# Patient Record
Sex: Male | Born: 1970 | Race: White | Hispanic: No | Marital: Married | Smoking: Former smoker
Health system: Southern US, Community
[De-identification: ages and names within clinical notes are randomized; demographics above are authoritative.]

---

## 2008-11-28 ENCOUNTER — Ambulatory Visit: Payer: Self-pay | Admitting: Unknown Physician Specialty

## 2010-08-13 ENCOUNTER — Ambulatory Visit: Payer: Self-pay | Admitting: Unknown Physician Specialty

## 2010-08-15 ENCOUNTER — Ambulatory Visit: Payer: Self-pay | Admitting: Unknown Physician Specialty

## 2011-04-15 ENCOUNTER — Ambulatory Visit: Payer: Self-pay | Admitting: Unknown Physician Specialty

## 2011-04-17 ENCOUNTER — Ambulatory Visit: Payer: Self-pay | Admitting: Unknown Physician Specialty

## 2012-07-15 IMAGING — NM NM THYROID IMAGING W/ UPTAKE SINGLE (24 HR)
1 series · 3 of 3 positions shown · non-contrast
Comparison: none

REASON FOR EXAM: hyperthyroid
COMMENTS:

[Series 1000: (id) thyroid scan · 2.40mm/px · 3 of 3 slices shown]
[im 1/3]
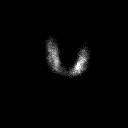
[im 2/3]
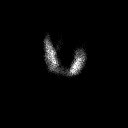
[im 3/3]
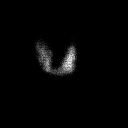

[3 of 3 positions shown; findings below may reference images not displayed]

PROCEDURE:     KNM - KNM THYROID N-E8C 24HR [DATE]  [DATE]

RESULT:     Following oral administration of 151.3 uCi radioactive iodine
N-E8C, the 7-hour thyroid uptake measures 39.9% and the 24-hour uptake
measures 50.5%. These values are in the hyperthyroid range.

Thyroid scan was performed in the anterior and both oblique views. The
thyroid lobes basically are symmetrical. No hot or cold nodules are
identified.
IMPRESSION: 1. Thyroid uptake values are in the hyperthyroid range compatible with
persistent or recurrent hyperthyroidism.
2. Normal thyroid scan.

## 2015-08-29 DIAGNOSIS — Z0000 Encounter for general adult medical examination without abnormal findings: Secondary | ICD-10-CM | POA: Diagnosis not present

## 2015-08-29 DIAGNOSIS — E039 Hypothyroidism, unspecified: Secondary | ICD-10-CM | POA: Diagnosis not present

## 2015-08-29 DIAGNOSIS — E0500 Thyrotoxicosis with diffuse goiter without thyrotoxic crisis or storm: Secondary | ICD-10-CM | POA: Diagnosis not present

## 2015-08-29 DIAGNOSIS — E890 Postprocedural hypothyroidism: Secondary | ICD-10-CM | POA: Diagnosis not present

## 2015-08-29 DIAGNOSIS — Z125 Encounter for screening for malignant neoplasm of prostate: Secondary | ICD-10-CM | POA: Diagnosis not present

## 2015-09-05 DIAGNOSIS — Z0000 Encounter for general adult medical examination without abnormal findings: Secondary | ICD-10-CM | POA: Diagnosis not present

## 2015-09-05 DIAGNOSIS — E0500 Thyrotoxicosis with diffuse goiter without thyrotoxic crisis or storm: Secondary | ICD-10-CM | POA: Diagnosis not present

## 2015-09-05 DIAGNOSIS — E890 Postprocedural hypothyroidism: Secondary | ICD-10-CM | POA: Diagnosis not present

## 2016-11-25 DIAGNOSIS — Z125 Encounter for screening for malignant neoplasm of prostate: Secondary | ICD-10-CM | POA: Diagnosis not present

## 2016-11-25 DIAGNOSIS — E890 Postprocedural hypothyroidism: Secondary | ICD-10-CM | POA: Diagnosis not present

## 2016-11-25 DIAGNOSIS — Z0000 Encounter for general adult medical examination without abnormal findings: Secondary | ICD-10-CM | POA: Diagnosis not present

## 2016-12-11 DIAGNOSIS — E0500 Thyrotoxicosis with diffuse goiter without thyrotoxic crisis or storm: Secondary | ICD-10-CM | POA: Diagnosis not present

## 2016-12-11 DIAGNOSIS — R002 Palpitations: Secondary | ICD-10-CM | POA: Diagnosis not present

## 2016-12-11 DIAGNOSIS — E890 Postprocedural hypothyroidism: Secondary | ICD-10-CM | POA: Diagnosis not present

## 2016-12-11 DIAGNOSIS — Z0000 Encounter for general adult medical examination without abnormal findings: Secondary | ICD-10-CM | POA: Diagnosis not present

## 2017-12-31 DIAGNOSIS — E0500 Thyrotoxicosis with diffuse goiter without thyrotoxic crisis or storm: Secondary | ICD-10-CM | POA: Diagnosis not present

## 2017-12-31 DIAGNOSIS — E890 Postprocedural hypothyroidism: Secondary | ICD-10-CM | POA: Diagnosis not present

## 2017-12-31 DIAGNOSIS — Z0000 Encounter for general adult medical examination without abnormal findings: Secondary | ICD-10-CM | POA: Diagnosis not present

## 2017-12-31 DIAGNOSIS — E782 Mixed hyperlipidemia: Secondary | ICD-10-CM | POA: Diagnosis not present

## 2018-01-07 DIAGNOSIS — Z0000 Encounter for general adult medical examination without abnormal findings: Secondary | ICD-10-CM | POA: Diagnosis not present

## 2018-01-07 DIAGNOSIS — Z1212 Encounter for screening for malignant neoplasm of rectum: Secondary | ICD-10-CM | POA: Diagnosis not present

## 2018-01-07 DIAGNOSIS — E890 Postprocedural hypothyroidism: Secondary | ICD-10-CM | POA: Diagnosis not present

## 2018-01-07 DIAGNOSIS — E0500 Thyrotoxicosis with diffuse goiter without thyrotoxic crisis or storm: Secondary | ICD-10-CM | POA: Diagnosis not present

## 2018-01-07 DIAGNOSIS — E782 Mixed hyperlipidemia: Secondary | ICD-10-CM | POA: Diagnosis not present

## 2018-03-31 DIAGNOSIS — E0590 Thyrotoxicosis, unspecified without thyrotoxic crisis or storm: Secondary | ICD-10-CM | POA: Diagnosis not present

## 2018-06-19 ENCOUNTER — Encounter: Payer: Self-pay | Admitting: Family Medicine

## 2018-06-19 ENCOUNTER — Ambulatory Visit: Payer: Self-pay | Admitting: Family Medicine

## 2018-06-19 ENCOUNTER — Other Ambulatory Visit: Payer: Self-pay

## 2018-06-19 VITALS — BP 100/72 | HR 73 | Temp 97.9°F | Resp 16 | Wt 186.6 lb

## 2018-06-19 DIAGNOSIS — H6983 Other specified disorders of Eustachian tube, bilateral: Secondary | ICD-10-CM

## 2018-06-19 DIAGNOSIS — H60501 Unspecified acute noninfective otitis externa, right ear: Secondary | ICD-10-CM

## 2018-06-19 MED ORDER — CIPROFLOXACIN-DEXAMETHASONE 0.3-0.1 % OT SUSP: 4.0000 [drp] | Freq: Two times a day (BID) | OTIC | 0 refills | Status: AC

## 2018-06-19 NOTE — Progress Notes (Signed)
George Long is a 48 y.o. male who presents today with 6 days of increasing ear pain and pressure of the right ear after recent vacation where the patient admits to snorkeling and swimming and airplane travel. He purchase some over the counter ear drops that have lidocaine in them and has been using them as prescribed without much symptom relief. He presents today for evaluation of ear fullness and pain. He denies any fever or systemic symptoms or any facial pain or recent illness. He does report seasonal allergies that he does not use any medication to treat.  Review of Systems  Constitutional: Negative for chills, fever and malaise/fatigue.  HENT: Positive for ear pain. Negative for congestion, ear discharge, hearing loss, sinus pain, sore throat and tinnitus.   Eyes: Negative.   Respiratory: Negative for cough, sputum production and shortness of breath.   Cardiovascular: Negative.  Negative for chest pain.  Gastrointestinal: Negative for abdominal pain, diarrhea, nausea and vomiting.  Genitourinary: Negative for dysuria, frequency, hematuria and urgency.  Musculoskeletal: Negative for myalgias.  Skin: Negative.   Neurological: Negative for headaches.  Endo/Heme/Allergies: Negative.   Psychiatric/Behavioral: Negative.     George Long has a current medication list which includes the following prescription(s): ibuprofen, levothyroxine, and ciprofloxacin-dexamethasone. Also is allergic to penicillins.  George Long  has no past medical history on file. Also  has no past surgical history on file.    O: Vitals:   06/19/18 0931  BP: 100/72  Pulse: 73  Resp: 16  Temp: 97.9 F (36.6 C)  SpO2: 98%     Physical Exam Vitals signs reviewed.  Constitutional:      General: He is not in acute distress.    Appearance: Normal appearance. He is well-developed and normal weight. He is not ill-appearing, toxic-appearing or diaphoretic.  HENT:     Head: Normocephalic.     Right Ear: Hearing normal.  Swelling and tenderness present. A middle ear effusion is present.     Left Ear: Hearing, ear canal and external ear normal. A middle ear effusion is present.     Ears:     Comments: Evidence of erythema and skin irritation to the inner canal    Nose: Septal deviation and rhinorrhea present. No congestion.     Right Sinus: No maxillary sinus tenderness or frontal sinus tenderness.     Left Sinus: No maxillary sinus tenderness or frontal sinus tenderness.     Mouth/Throat:     Mouth: Mucous membranes are moist.     Pharynx: Uvula midline. No oropharyngeal exudate or posterior oropharyngeal erythema.  Eyes:     General: Lids are normal.     Extraocular Movements: Extraocular movements intact.     Conjunctiva/sclera: Conjunctivae normal.  Neck:     Musculoskeletal: Full passive range of motion without pain, normal range of motion and neck supple.     Thyroid: Thyromegaly present.     Trachea: Trachea normal.  Cardiovascular:     Rate and Rhythm: Normal rate and regular rhythm.     Pulses: Normal pulses.     Heart sounds: Normal heart sounds.  Pulmonary:     Effort: Pulmonary effort is normal.     Breath sounds: Normal breath sounds.  Abdominal:     General: Bowel sounds are normal.     Palpations: Abdomen is soft.  Musculoskeletal: Normal range of motion.  Lymphadenopathy:     Head:     Right side of head: No submental, submandibular or tonsillar adenopathy.  Left side of head: No submental, submandibular or tonsillar adenopathy.     Cervical: No cervical adenopathy.     Right cervical: No superficial cervical adenopathy.    Left cervical: No superficial cervical adenopathy.  Skin:    General: Skin is warm.  Neurological:     Mental Status: He is alert and oriented to person, place, and time.  Psychiatric:        Mood and Affect: Mood normal.        Behavior: Behavior is cooperative.    A: 1. Acute otitis externa of right ear, unspecified type   2. Eustachian tube  dysfunction, bilateral    P: 1. Acute otitis externa of right ear, unspecified type Will treat for otitis externa - ciprofloxacin-dexamethasone (CIPRODEX) OTIC suspension; Place 4 drops into the right ear 2 (two) times daily for 7 days.  2. Eustachian tube dysfunction, bilateral Reviewed with patient instructions for supportive care yawning, chewing gum, forceful swallowing- follow up if condition persists beyond 7 days.  Other orders - levothyroxine (SYNTHROID, LEVOTHROID) 125 MCG tablet; TK 1 T PO QAM OES - ibuprofen (ADVIL,MOTRIN) 200 MG tablet; Take 200 mg by mouth every 6 (six) hours as needed.   Discussed with patient exam findings, suspected diagnosis etiology and  reviewed recommended treatment plan and follow up, including complications and indications for urgent medical follow up and evaluation. Medications including use and indications reviewed with patient. Patient provided relevant patient education on diagnosis and/or relevant related condition that were discussed and reviewed with patient at discharge. Patient verbalized understanding of information provided and agrees with plan of care (POC), all questions answered.

## 2018-06-19 NOTE — Patient Instructions (Signed)
Otitis Externa    Otitis externa is an infection of the outer ear canal. The outer ear canal is the area between the outside of the ear and the eardrum. Otitis externa is sometimes called swimmer's ear.  What are the causes?  Common causes of this condition include:   Swimming in dirty water.   Moisture in the ear.   An injury to the inside of the ear.   An object stuck in the ear.   A cut or scrape on the outside of the ear.  What increases the risk?  You are more likely to develop this condition if you go swimming often.  What are the signs or symptoms?  The first symptom of this condition is often itching in the ear. Later symptoms of the condition include:   Swelling of the ear.   Redness in the ear.   Ear pain. The pain may get worse when you pull on your ear.   Pus coming from the ear.  How is this diagnosed?  This condition may be diagnosed by examining the ear and testing fluid from the ear for bacteria and funguses.  How is this treated?  This condition may be treated with:   Antibiotic ear drops. These are often given for 10-14 days.   Medicines to reduce itching and swelling.  Follow these instructions at home:   If you were prescribed antibiotic ear drops, use them as told by your health care provider. Do not stop using the antibiotic even if your condition improves.   Take over-the-counter and prescription medicines only as told by your health care provider.   Avoid getting water in your ears as told by your health care provider. This may include avoiding swimming or water sports for a few days.   Keep all follow-up visits as told by your health care provider. This is important.  How is this prevented?   Keep your ears dry. Use the corner of a towel to dry your ears after you swim or bathe.   Avoid scratching or putting things in your ear. Doing these things can damage the ear canal or remove the protective wax that lines it, which makes it easier for bacteria and funguses to  grow.   Avoid swimming in lakes, polluted water, or pools that may not have enough chlorine.  Contact a health care provider if:   You have a fever.   Your ear is still red, swollen, painful, or draining pus after 3 days.   Your redness, swelling, or pain gets worse.   You have a severe headache.   You have redness, swelling, pain, or tenderness in the area behind your ear.  Summary   Otitis externa is an infection of the outer ear canal.   Common causes include swimming in dirty water, moisture in the ear, or a cut or scrape in the ear.   Symptoms include pain, redness, and swelling of the ear.   If you were prescribed antibiotic ear drops, use them as told by your health care provider. Do not stop using the antibiotic even if your condition improves.  This information is not intended to replace advice given to you by your health care provider. Make sure you discuss any questions you have with your health care provider.  Document Released: 04/29/2005 Document Revised: 10/03/2017 Document Reviewed: 10/03/2017  Elsevier Interactive Patient Education  2019 [employer]  Eustachian Tube Dysfunction    Eustachian tube dysfunction refers to a condition in which a   blockage develops in the narrow passage that connects the middle ear to the back of the nose (eustachian tube). The eustachian tube regulates air pressure in the middle ear by letting air move between the ear and nose. It also helps to drain fluid from the middle ear space.  Eustachian tube dysfunction can affect one or both ears. When the eustachian tube does not function properly, air pressure, fluid, or both can build up in the middle ear.  What are the causes?  This condition occurs when the eustachian tube becomes blocked or cannot open normally. Common causes of this condition include:   Ear infections.   Colds and other infections that affect the nose, mouth, and throat (upper respiratory tract).   Allergies.   Irritation from cigarette  smoke.   Irritation from stomach acid coming up into the esophagus (gastroesophageal reflux). The esophagus is the tube that carries food from the mouth to the stomach.   Sudden changes in air pressure, such as from descending in an airplane or scuba diving.   Abnormal growths in the nose or throat, such as:  ? Growths that line the nose (nasal polyps).  ? Abnormal growth of cells (tumors).  ? Enlarged tissue at the back of the throat (adenoids).  What increases the risk?  You are more likely to develop this condition if:   You smoke.   You are overweight.   You are a child who has:  ? Certain birth defects of the mouth, such as cleft palate.  ? Large tonsils or adenoids.  What are the signs or symptoms?  Common symptoms of this condition include:   A feeling of fullness in the ear.   Ear pain.   Clicking or popping noises in the ear.   Ringing in the ear.   Hearing loss.   Loss of balance.   Dizziness.  Symptoms may get worse when the air pressure around you changes, such as when you travel to an area of high elevation, fly on an airplane, or go scuba diving.  How is this diagnosed?  This condition may be diagnosed based on:   Your symptoms.   A physical exam of your ears, nose, and throat.   Tests, such as those that measure:  ? The movement of your eardrum (tympanogram).  ? Your hearing (audiometry).  How is this treated?  Treatment depends on the cause and severity of your condition.   In mild cases, you may relieve your symptoms by moving air into your ears. This is called "popping the ears."   In more severe cases, or if you have symptoms of fluid in your ears, treatment may include:  ? Medicines to relieve congestion (decongestants).  ? Medicines that treat allergies (antihistamines).  ? Nasal sprays or ear drops that contain medicines that reduce swelling (steroids).  ? A procedure to drain the fluid in your eardrum (myringotomy). In this procedure, a small tube is placed in the eardrum  to:   Drain the fluid.   Restore the air in the middle ear space.  ? A procedure to insert a balloon device through the nose to inflate the opening of the eustachian tube (balloon dilation).  Follow these instructions at home:  Lifestyle   Do not do any of the following until your health care provider approves:  ? Travel to high altitudes.  ? Fly in airplanes.  ? Work in a pressurized cabin or room.  ? Scuba dive.   Do not use any   products that contain nicotine or tobacco, such as cigarettes and e-cigarettes. If you need help quitting, ask your health care provider.   Keep your ears dry. Wear fitted earplugs during showering and bathing. Dry your ears completely after.  General instructions   Take over-the-counter and prescription medicines only as told by your health care provider.   Use techniques to help pop your ears as recommended by your health care provider. These may include:  ? Chewing gum.  ? Yawning.  ? Frequent, forceful swallowing.  ? Closing your mouth, holding your nose closed, and gently blowing as if you are trying to blow air out of your nose.   Keep all follow-up visits as told by your health care provider. This is important.  Contact a health care provider if:   Your symptoms do not go away after treatment.   Your symptoms come back after treatment.   You are unable to pop your ears.   You have:  ? A fever.  ? Pain in your ear.  ? Pain in your head or neck.  ? Fluid draining from your ear.   Your hearing suddenly changes.   You become very dizzy.   You lose your balance.  Summary   Eustachian tube dysfunction refers to a condition in which a blockage develops in the eustachian tube.   It can be caused by ear infections, allergies, inhaled irritants, or abnormal growths in the nose or throat.   Symptoms include ear pain, hearing loss, or ringing in the ears.   Mild cases are treated with maneuvers to unblock the ears, such as yawning or ear popping.   Severe cases are treated  with medicines. Surgery may also be done (rare).  This information is not intended to replace advice given to you by your health care provider. Make sure you discuss any questions you have with your health care provider.  Document Released: 05/26/2015 Document Revised: 08/19/2017 Document Reviewed: 08/19/2017  Elsevier Interactive Patient Education  2019 [employer]

## 2018-06-23 ENCOUNTER — Telehealth: Payer: Self-pay | Admitting: Emergency Medicine

## 2018-06-23 NOTE — Telephone Encounter (Signed)
Attempted to contact patient following up on visit with [facility]. No answer unable to leave message

## 2018-12-09 DIAGNOSIS — E0500 Thyrotoxicosis with diffuse goiter without thyrotoxic crisis or storm: Secondary | ICD-10-CM | POA: Diagnosis not present

## 2018-12-09 DIAGNOSIS — H532 Diplopia: Secondary | ICD-10-CM | POA: Diagnosis not present

## 2018-12-09 DIAGNOSIS — H05243 Constant exophthalmos, bilateral: Secondary | ICD-10-CM | POA: Diagnosis not present

## 2018-12-09 DIAGNOSIS — H47393 Other disorders of optic disc, bilateral: Secondary | ICD-10-CM | POA: Diagnosis not present

## 2018-12-15 DIAGNOSIS — H5008 Alternating esotropia with other noncomitancies: Secondary | ICD-10-CM | POA: Diagnosis not present

## 2018-12-15 DIAGNOSIS — H5022 Vertical strabismus, left eye: Secondary | ICD-10-CM | POA: Diagnosis not present

## 2018-12-15 DIAGNOSIS — H05243 Constant exophthalmos, bilateral: Secondary | ICD-10-CM | POA: Diagnosis not present

## 2018-12-15 DIAGNOSIS — E0590 Thyrotoxicosis, unspecified without thyrotoxic crisis or storm: Secondary | ICD-10-CM | POA: Diagnosis not present

## 2019-01-13 DIAGNOSIS — Z0000 Encounter for general adult medical examination without abnormal findings: Secondary | ICD-10-CM | POA: Diagnosis not present

## 2019-01-13 DIAGNOSIS — E890 Postprocedural hypothyroidism: Secondary | ICD-10-CM | POA: Diagnosis not present

## 2019-01-13 DIAGNOSIS — Z125 Encounter for screening for malignant neoplasm of prostate: Secondary | ICD-10-CM | POA: Diagnosis not present

## 2019-01-20 DIAGNOSIS — Z23 Encounter for immunization: Secondary | ICD-10-CM | POA: Diagnosis not present

## 2019-01-20 DIAGNOSIS — Z7189 Other specified counseling: Secondary | ICD-10-CM | POA: Diagnosis not present

## 2019-01-20 DIAGNOSIS — E890 Postprocedural hypothyroidism: Secondary | ICD-10-CM | POA: Diagnosis not present

## 2019-01-20 DIAGNOSIS — E782 Mixed hyperlipidemia: Secondary | ICD-10-CM | POA: Diagnosis not present

## 2019-01-20 DIAGNOSIS — Z0000 Encounter for general adult medical examination without abnormal findings: Secondary | ICD-10-CM | POA: Diagnosis not present

## 2019-01-21 DIAGNOSIS — J029 Acute pharyngitis, unspecified: Secondary | ICD-10-CM | POA: Diagnosis not present

## 2019-01-25 DIAGNOSIS — H05213 Displacement (lateral) of globe, bilateral: Secondary | ICD-10-CM | POA: Diagnosis not present

## 2019-01-25 DIAGNOSIS — H532 Diplopia: Secondary | ICD-10-CM | POA: Diagnosis not present

## 2019-01-25 DIAGNOSIS — E063 Autoimmune thyroiditis: Secondary | ICD-10-CM | POA: Diagnosis not present

## 2019-02-09 DIAGNOSIS — E063 Autoimmune thyroiditis: Secondary | ICD-10-CM | POA: Diagnosis not present

## 2019-02-22 DIAGNOSIS — E063 Autoimmune thyroiditis: Secondary | ICD-10-CM | POA: Diagnosis not present

## 2019-02-22 DIAGNOSIS — H05213 Displacement (lateral) of globe, bilateral: Secondary | ICD-10-CM | POA: Diagnosis not present

## 2019-02-22 DIAGNOSIS — H532 Diplopia: Secondary | ICD-10-CM | POA: Diagnosis not present

## 2019-03-01 DIAGNOSIS — E063 Autoimmune thyroiditis: Secondary | ICD-10-CM | POA: Diagnosis not present

## 2019-03-08 DIAGNOSIS — E0590 Thyrotoxicosis, unspecified without thyrotoxic crisis or storm: Secondary | ICD-10-CM | POA: Diagnosis not present

## 2019-03-08 DIAGNOSIS — H5022 Vertical strabismus, left eye: Secondary | ICD-10-CM | POA: Diagnosis not present

## 2019-03-08 DIAGNOSIS — H5008 Alternating esotropia with other noncomitancies: Secondary | ICD-10-CM | POA: Diagnosis not present

## 2019-03-08 DIAGNOSIS — H05243 Constant exophthalmos, bilateral: Secondary | ICD-10-CM | POA: Diagnosis not present

## 2019-03-15 ENCOUNTER — Encounter (HOSPITAL_COMMUNITY): Payer: Self-pay

## 2019-03-15 ENCOUNTER — Ambulatory Visit (HOSPITAL_COMMUNITY)
Admission: EM | Admit: 2019-03-15 | Discharge: 2019-03-15 | Disposition: A | Discharge status: Home / Self Care | Payer: BC Managed Care – PPO | Attending: Family Medicine | Admitting: Family Medicine

## 2019-03-15 ENCOUNTER — Ambulatory Visit (INDEPENDENT_AMBULATORY_CARE_PROVIDER_SITE_OTHER): Payer: BC Managed Care – PPO

## 2019-03-15 ENCOUNTER — Other Ambulatory Visit: Payer: Self-pay

## 2019-03-15 DIAGNOSIS — S92354A Nondisplaced fracture of fifth metatarsal bone, right foot, initial encounter for closed fracture: Secondary | ICD-10-CM

## 2019-03-15 NOTE — ED Triage Notes (Signed)
Pt states he was camping 2 days ago and he kicked a piece of wood with his right foot, pt feel to the ground on his right side. Pt states he is being limping x 2 days. Pt states he right foot is swelling and painful.

## 2019-03-15 NOTE — Discharge Instructions (Addendum)
Use ice and elevation for pain and swelling Take ibuprofen or acetaminophen for pain Wear stiff shoe at all times that you are up on the foot  Follow up with your PCP or an orthopedic in a week or two

## 2019-03-15 NOTE — ED Provider Notes (Signed)
[facility]    CSN: [id number] Arrival date & time: 03/15/19  1204      History   Chief Complaint Chief Complaint  Patient presents with  . Foot Injury    HPI George Long is a 48 y.o. male.   HPI  Patient kicked a piece of wood with his right foot.  It was immediately painful.  He fell to the ground.  He has been having pain ever since.  He is limping.  The side of his foot is black and blue.  No prior problems with his feet. Hypothyroid.  Well-controlled on medication.  No other major medical problems.  History reviewed. No pertinent past medical history.  There are no active problems to display for this patient.   History reviewed. No pertinent surgical history.     Home Medications    Prior to Admission medications   Medication Sig Start Date End Date Taking? Authorizing Provider  ibuprofen (ADVIL,MOTRIN) 200 MG tablet Take 200 mg by mouth every 6 (six) hours as needed.    [provider]  levothyroxine (SYNTHROID, LEVOTHROID) 125 MCG tablet TK 1 T PO QAM OES 06/06/18   [provider]    Family History History reviewed. No pertinent family history.  Social History Social History   Tobacco Use  . Smoking status: Former Games developer  . Smokeless tobacco: Never Used  Substance Use Topics  . Alcohol use: Not on file  . Drug use: Not on file     Allergies   Penicillins   Review of Systems Review of Systems  Constitutional: Negative for chills and fever.  HENT: Negative for ear pain and sore throat.   Eyes: Negative for pain and visual disturbance.  Respiratory: Negative for cough and shortness of breath.   Cardiovascular: Negative for chest pain and palpitations.  Gastrointestinal: Negative for abdominal pain and vomiting.  Genitourinary: Negative for dysuria and hematuria.  Musculoskeletal: Positive for gait problem. Negative for arthralgias and back pain.  Skin: Negative for color change and rash.  Neurological: Negative  for seizures and syncope.  All other systems reviewed and are negative.    Physical Exam Triage Vital Signs ED Triage Vitals  Enc Vitals Group     BP 03/15/19 1224 128/87     Pulse Rate 03/15/19 1224 68     Resp 03/15/19 1224 16     Temp 03/15/19 1224 98.8 F (37.1 C)     Temp Source 03/15/19 1224 Oral     SpO2 03/15/19 1224 99 %     Weight --      Height --      Head Circumference --      Peak Flow --      Pain Score 03/15/19 1222 4     Pain Loc --      Pain Edu? --      Excl. in GC? --    No data found.  Updated Vital Signs BP 128/87 (BP Location: Right Arm)   Pulse 68   Temp 98.8 F (37.1 C) (Oral)   Resp 16   SpO2 99%       Physical Exam Constitutional:      General: He is not in acute distress.    Appearance: He is well-developed.  HENT:     Head: Normocephalic and atraumatic.  Eyes:     Conjunctiva/sclera: Conjunctivae normal.     Pupils: Pupils are equal, round, and reactive to light.  Neck:     Musculoskeletal: Normal  range of motion.  Cardiovascular:     Rate and Rhythm: Normal rate.  Pulmonary:     Effort: Pulmonary effort is normal. No respiratory distress.  Abdominal:     General: There is no distension.     Palpations: Abdomen is soft.  Musculoskeletal: Normal range of motion.     Comments: Swelling and discoloration of right foot.  Purple discoloration around across the lateral foot and toes.  Tenderness is localized to the tip of the proximal fifth metatarsal  Skin:    General: Skin is warm and dry.  Neurological:     Mental Status: He is alert.      UC Treatments / Results  Labs (all labs ordered are listed, but only abnormal results are displayed) Labs Reviewed - No data to display  EKG   Radiology Dg Foot Complete Right  Result Date: 03/15/2019 CLINICAL DATA:  Lateral foot pain and swelling after tripping over a piece of wood while camping on Friday. EXAM: RIGHT FOOT COMPLETE - 3+ VIEW COMPARISON:  None. FINDINGS: Acute  avulsion fracture of the base of the fifth metatarsal with 3 mm medial displacement. No additional fracture. No dislocation. Joint spaces are preserved. Bone mineralization is normal. Soft tissues are unremarkable. IMPRESSION: 1. Acute mildly displaced avulsion fracture at the base of the fifth metatarsal. Electronically Signed   By: Titus Dubin M.D.   On: 03/15/2019 12:45    Procedures Procedures (including critical care time)  Medications Ordered in UC Medications - No data to display  Initial Impression / Assessment and Plan / UC Course  I have reviewed the triage vital signs and the nursing notes.  Pertinent labs & imaging results that were available during my care of the patient were reviewed by me and considered in my medical decision making (see chart for details).     A proximal fifth metatarsal fracture that looks like it will heal well with conservative care.  Not in the Jones fracture area that will need surgery.  Recommend follow-up with PCP or Ortho Final Clinical Impressions(s) / UC Diagnoses   Final diagnoses:  Nondisplaced fracture of fifth metatarsal bone, right foot, initial encounter for closed fracture     Discharge Instructions     Use ice and elevation for pain and swelling Take ibuprofen or acetaminophen for pain Wear stiff shoe at all times that you are up on the foot  Follow up with your PCP or an orthopedic in a week or two    ED Prescriptions    None     PDMP not reviewed this encounter.   Raylene Everts, MD 03/15/19 2016

## 2019-03-30 DIAGNOSIS — E063 Autoimmune thyroiditis: Secondary | ICD-10-CM | POA: Diagnosis not present

## 2019-03-30 DIAGNOSIS — E0500 Thyrotoxicosis with diffuse goiter without thyrotoxic crisis or storm: Secondary | ICD-10-CM | POA: Diagnosis not present

## 2019-04-20 DIAGNOSIS — E063 Autoimmune thyroiditis: Secondary | ICD-10-CM | POA: Diagnosis not present

## 2019-04-20 DIAGNOSIS — E0500 Thyrotoxicosis with diffuse goiter without thyrotoxic crisis or storm: Secondary | ICD-10-CM | POA: Diagnosis not present

## 2019-04-26 DIAGNOSIS — H05243 Constant exophthalmos, bilateral: Secondary | ICD-10-CM | POA: Diagnosis not present

## 2019-04-26 DIAGNOSIS — H5008 Alternating esotropia with other noncomitancies: Secondary | ICD-10-CM | POA: Diagnosis not present

## 2019-04-26 DIAGNOSIS — E0590 Thyrotoxicosis, unspecified without thyrotoxic crisis or storm: Secondary | ICD-10-CM | POA: Diagnosis not present

## 2019-04-26 DIAGNOSIS — H5022 Vertical strabismus, left eye: Secondary | ICD-10-CM | POA: Diagnosis not present

## 2019-05-03 DIAGNOSIS — E063 Autoimmune thyroiditis: Secondary | ICD-10-CM | POA: Diagnosis not present

## 2019-05-03 DIAGNOSIS — H532 Diplopia: Secondary | ICD-10-CM | POA: Diagnosis not present

## 2019-05-03 DIAGNOSIS — H05213 Displacement (lateral) of globe, bilateral: Secondary | ICD-10-CM | POA: Diagnosis not present

## 2019-05-26 DIAGNOSIS — E0590 Thyrotoxicosis, unspecified without thyrotoxic crisis or storm: Secondary | ICD-10-CM | POA: Diagnosis not present

## 2019-05-26 DIAGNOSIS — H5008 Alternating esotropia with other noncomitancies: Secondary | ICD-10-CM | POA: Diagnosis not present

## 2019-05-26 DIAGNOSIS — H05243 Constant exophthalmos, bilateral: Secondary | ICD-10-CM | POA: Diagnosis not present

## 2019-05-26 DIAGNOSIS — H5022 Vertical strabismus, left eye: Secondary | ICD-10-CM | POA: Diagnosis not present

## 2019-06-09 DIAGNOSIS — H532 Diplopia: Secondary | ICD-10-CM | POA: Diagnosis not present

## 2019-06-09 DIAGNOSIS — H05213 Displacement (lateral) of globe, bilateral: Secondary | ICD-10-CM | POA: Diagnosis not present

## 2019-06-09 DIAGNOSIS — E063 Autoimmune thyroiditis: Secondary | ICD-10-CM | POA: Diagnosis not present

## 2019-08-02 DIAGNOSIS — H532 Diplopia: Secondary | ICD-10-CM | POA: Diagnosis not present

## 2019-08-02 DIAGNOSIS — H05213 Displacement (lateral) of globe, bilateral: Secondary | ICD-10-CM | POA: Diagnosis not present

## 2019-08-02 DIAGNOSIS — E063 Autoimmune thyroiditis: Secondary | ICD-10-CM | POA: Diagnosis not present

## 2019-08-30 DIAGNOSIS — H05243 Constant exophthalmos, bilateral: Secondary | ICD-10-CM | POA: Diagnosis not present

## 2019-08-30 DIAGNOSIS — H532 Diplopia: Secondary | ICD-10-CM | POA: Diagnosis not present

## 2019-08-30 DIAGNOSIS — H5022 Vertical strabismus, left eye: Secondary | ICD-10-CM | POA: Diagnosis not present

## 2019-08-30 DIAGNOSIS — H5008 Alternating esotropia with other noncomitancies: Secondary | ICD-10-CM | POA: Diagnosis not present

## 2019-09-01 DIAGNOSIS — E063 Autoimmune thyroiditis: Secondary | ICD-10-CM | POA: Diagnosis not present

## 2019-09-01 DIAGNOSIS — E0500 Thyrotoxicosis with diffuse goiter without thyrotoxic crisis or storm: Secondary | ICD-10-CM | POA: Diagnosis not present

## 2019-09-22 DIAGNOSIS — E063 Autoimmune thyroiditis: Secondary | ICD-10-CM | POA: Diagnosis not present

## 2019-09-22 DIAGNOSIS — E0500 Thyrotoxicosis with diffuse goiter without thyrotoxic crisis or storm: Secondary | ICD-10-CM | POA: Diagnosis not present

## 2019-10-13 DIAGNOSIS — E0500 Thyrotoxicosis with diffuse goiter without thyrotoxic crisis or storm: Secondary | ICD-10-CM | POA: Diagnosis not present

## 2019-10-13 DIAGNOSIS — E063 Autoimmune thyroiditis: Secondary | ICD-10-CM | POA: Diagnosis not present

## 2019-10-20 DIAGNOSIS — H5022 Vertical strabismus, left eye: Secondary | ICD-10-CM | POA: Diagnosis not present

## 2019-10-20 DIAGNOSIS — H5008 Alternating esotropia with other noncomitancies: Secondary | ICD-10-CM | POA: Diagnosis not present

## 2019-10-20 DIAGNOSIS — H05243 Constant exophthalmos, bilateral: Secondary | ICD-10-CM | POA: Diagnosis not present

## 2019-10-20 DIAGNOSIS — E0590 Thyrotoxicosis, unspecified without thyrotoxic crisis or storm: Secondary | ICD-10-CM | POA: Diagnosis not present

## 2019-11-02 DIAGNOSIS — E063 Autoimmune thyroiditis: Secondary | ICD-10-CM | POA: Diagnosis not present

## 2019-11-02 DIAGNOSIS — H532 Diplopia: Secondary | ICD-10-CM | POA: Diagnosis not present

## 2019-11-02 DIAGNOSIS — H05213 Displacement (lateral) of globe, bilateral: Secondary | ICD-10-CM | POA: Diagnosis not present

## 2019-11-03 DIAGNOSIS — E063 Autoimmune thyroiditis: Secondary | ICD-10-CM | POA: Diagnosis not present

## 2019-11-03 DIAGNOSIS — E0500 Thyrotoxicosis with diffuse goiter without thyrotoxic crisis or storm: Secondary | ICD-10-CM | POA: Diagnosis not present

## 2019-11-24 DIAGNOSIS — E063 Autoimmune thyroiditis: Secondary | ICD-10-CM | POA: Diagnosis not present

## 2019-11-24 DIAGNOSIS — E0500 Thyrotoxicosis with diffuse goiter without thyrotoxic crisis or storm: Secondary | ICD-10-CM | POA: Diagnosis not present

## 2019-12-01 ENCOUNTER — Other Ambulatory Visit: Payer: BC Managed Care – PPO

## 2019-12-15 DIAGNOSIS — E0500 Thyrotoxicosis with diffuse goiter without thyrotoxic crisis or storm: Secondary | ICD-10-CM | POA: Diagnosis not present

## 2019-12-15 DIAGNOSIS — E063 Autoimmune thyroiditis: Secondary | ICD-10-CM | POA: Diagnosis not present

## 2020-01-06 DIAGNOSIS — H05243 Constant exophthalmos, bilateral: Secondary | ICD-10-CM | POA: Diagnosis not present

## 2020-01-06 DIAGNOSIS — H524 Presbyopia: Secondary | ICD-10-CM | POA: Diagnosis not present

## 2020-01-25 DIAGNOSIS — E890 Postprocedural hypothyroidism: Secondary | ICD-10-CM | POA: Diagnosis not present

## 2020-01-25 DIAGNOSIS — Z0000 Encounter for general adult medical examination without abnormal findings: Secondary | ICD-10-CM | POA: Diagnosis not present

## 2020-01-25 DIAGNOSIS — E782 Mixed hyperlipidemia: Secondary | ICD-10-CM | POA: Diagnosis not present

## 2020-01-25 DIAGNOSIS — Z125 Encounter for screening for malignant neoplasm of prostate: Secondary | ICD-10-CM | POA: Diagnosis not present

## 2020-01-26 DIAGNOSIS — E063 Autoimmune thyroiditis: Secondary | ICD-10-CM | POA: Diagnosis not present

## 2020-01-26 DIAGNOSIS — H532 Diplopia: Secondary | ICD-10-CM | POA: Diagnosis not present

## 2020-01-26 DIAGNOSIS — H05213 Displacement (lateral) of globe, bilateral: Secondary | ICD-10-CM | POA: Diagnosis not present

## 2020-02-02 DIAGNOSIS — E890 Postprocedural hypothyroidism: Secondary | ICD-10-CM | POA: Diagnosis not present

## 2020-02-02 DIAGNOSIS — E782 Mixed hyperlipidemia: Secondary | ICD-10-CM | POA: Diagnosis not present

## 2020-02-02 DIAGNOSIS — E0500 Thyrotoxicosis with diffuse goiter without thyrotoxic crisis or storm: Secondary | ICD-10-CM | POA: Diagnosis not present

## 2020-02-02 DIAGNOSIS — Z23 Encounter for immunization: Secondary | ICD-10-CM | POA: Diagnosis not present

## 2020-02-02 DIAGNOSIS — Z0000 Encounter for general adult medical examination without abnormal findings: Secondary | ICD-10-CM | POA: Diagnosis not present

## 2020-04-20 DIAGNOSIS — E782 Mixed hyperlipidemia: Secondary | ICD-10-CM | POA: Diagnosis not present

## 2020-04-20 DIAGNOSIS — E890 Postprocedural hypothyroidism: Secondary | ICD-10-CM | POA: Diagnosis not present

## 2020-04-20 DIAGNOSIS — E0500 Thyrotoxicosis with diffuse goiter without thyrotoxic crisis or storm: Secondary | ICD-10-CM | POA: Diagnosis not present

## 2020-04-26 DIAGNOSIS — E782 Mixed hyperlipidemia: Secondary | ICD-10-CM | POA: Diagnosis not present

## 2020-04-26 DIAGNOSIS — E890 Postprocedural hypothyroidism: Secondary | ICD-10-CM | POA: Diagnosis not present

## 2020-04-26 DIAGNOSIS — E0500 Thyrotoxicosis with diffuse goiter without thyrotoxic crisis or storm: Secondary | ICD-10-CM | POA: Diagnosis not present

## 2020-06-05 DIAGNOSIS — H532 Diplopia: Secondary | ICD-10-CM | POA: Diagnosis not present

## 2020-06-05 DIAGNOSIS — H05213 Displacement (lateral) of globe, bilateral: Secondary | ICD-10-CM | POA: Diagnosis not present

## 2020-06-05 DIAGNOSIS — E063 Autoimmune thyroiditis: Secondary | ICD-10-CM | POA: Diagnosis not present

## 2020-06-08 DIAGNOSIS — E063 Autoimmune thyroiditis: Secondary | ICD-10-CM | POA: Diagnosis not present

## 2020-06-13 DIAGNOSIS — H05213 Displacement (lateral) of globe, bilateral: Secondary | ICD-10-CM | POA: Diagnosis not present

## 2020-06-14 IMAGING — DX DG FOOT COMPLETE 3+V*R*
3 series · 3 of 3 positions shown · non-contrast
Comparison: None.

CLINICAL DATA: Lateral foot pain and swelling after tripping over a
piece of Jumper while camping on [REDACTED].

EXAM:
RIGHT FOOT COMPLETE - 3+ VIEW

[foot ap]
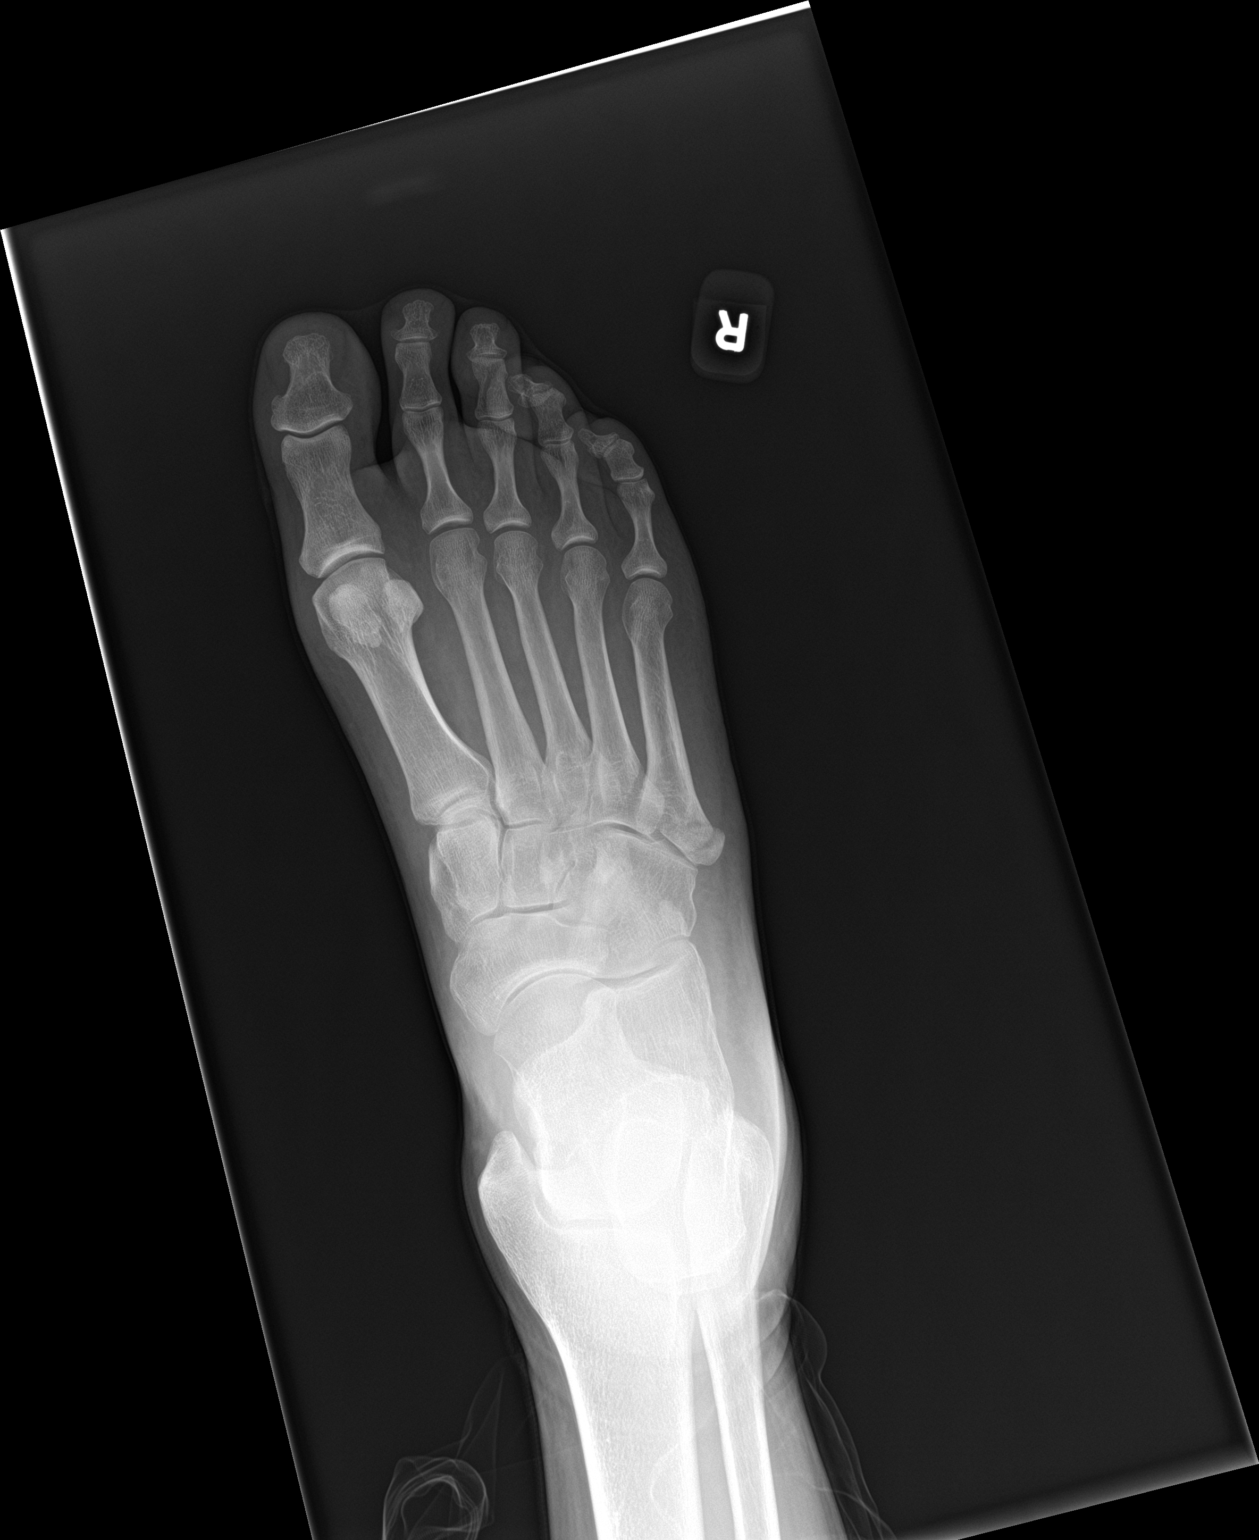

[foot obl]
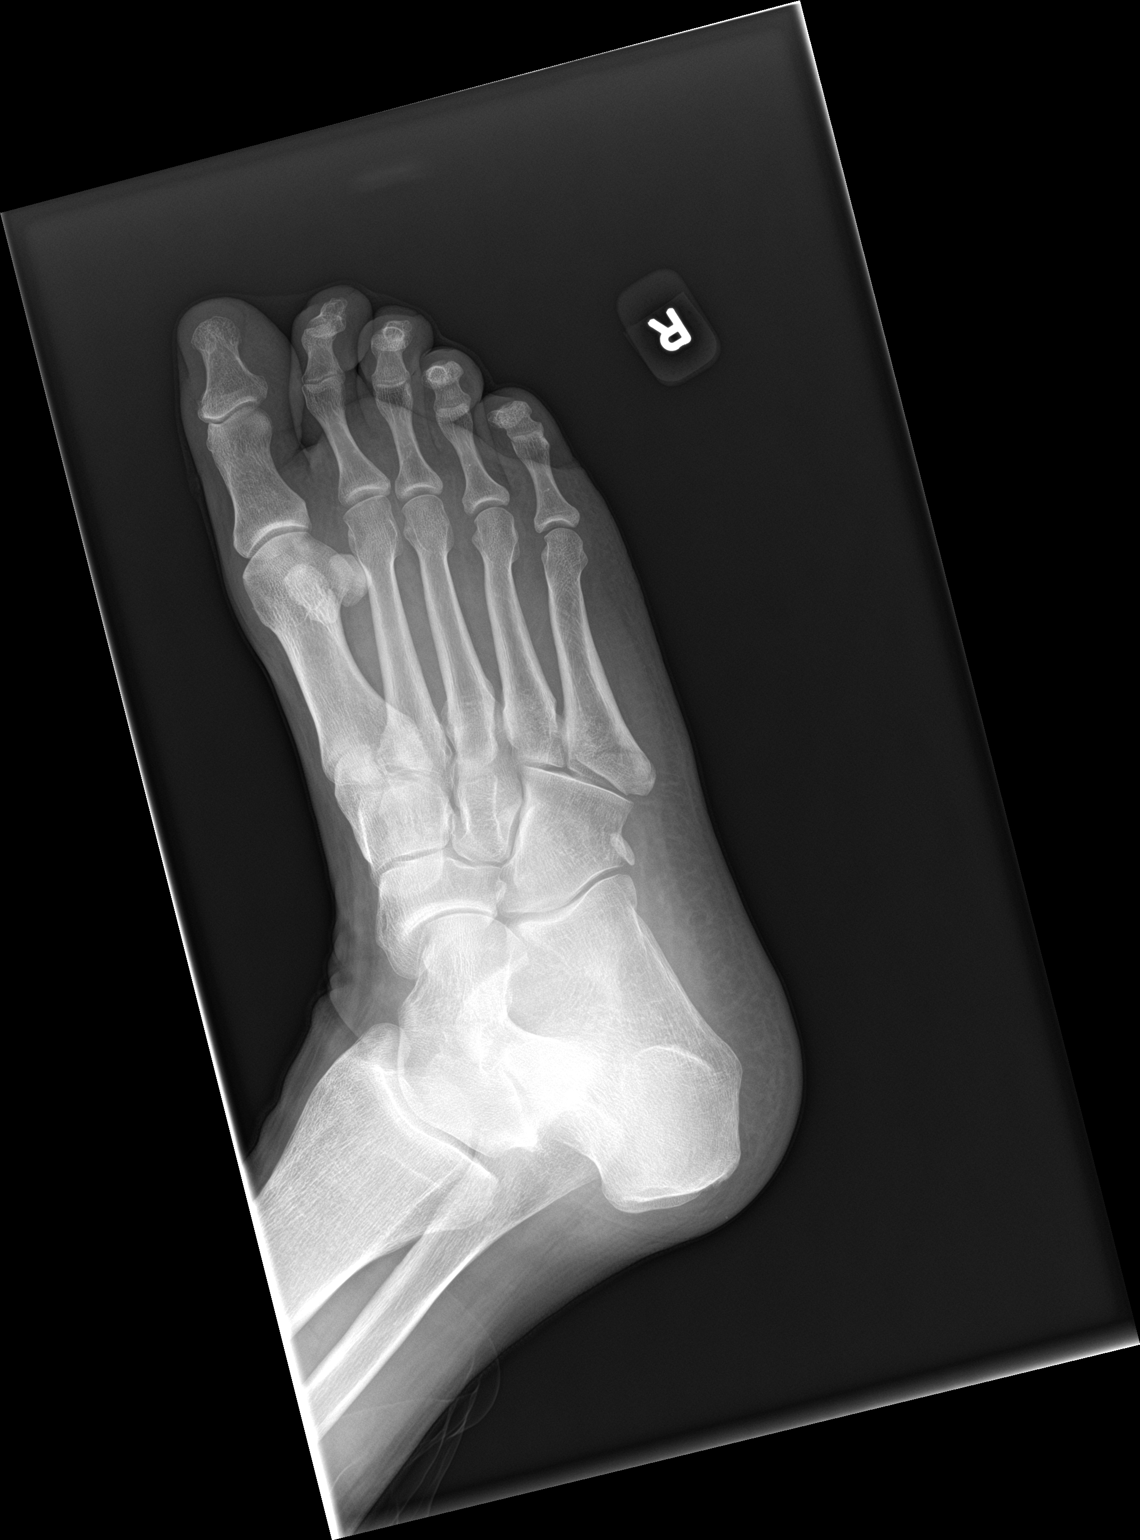

[foot lat]
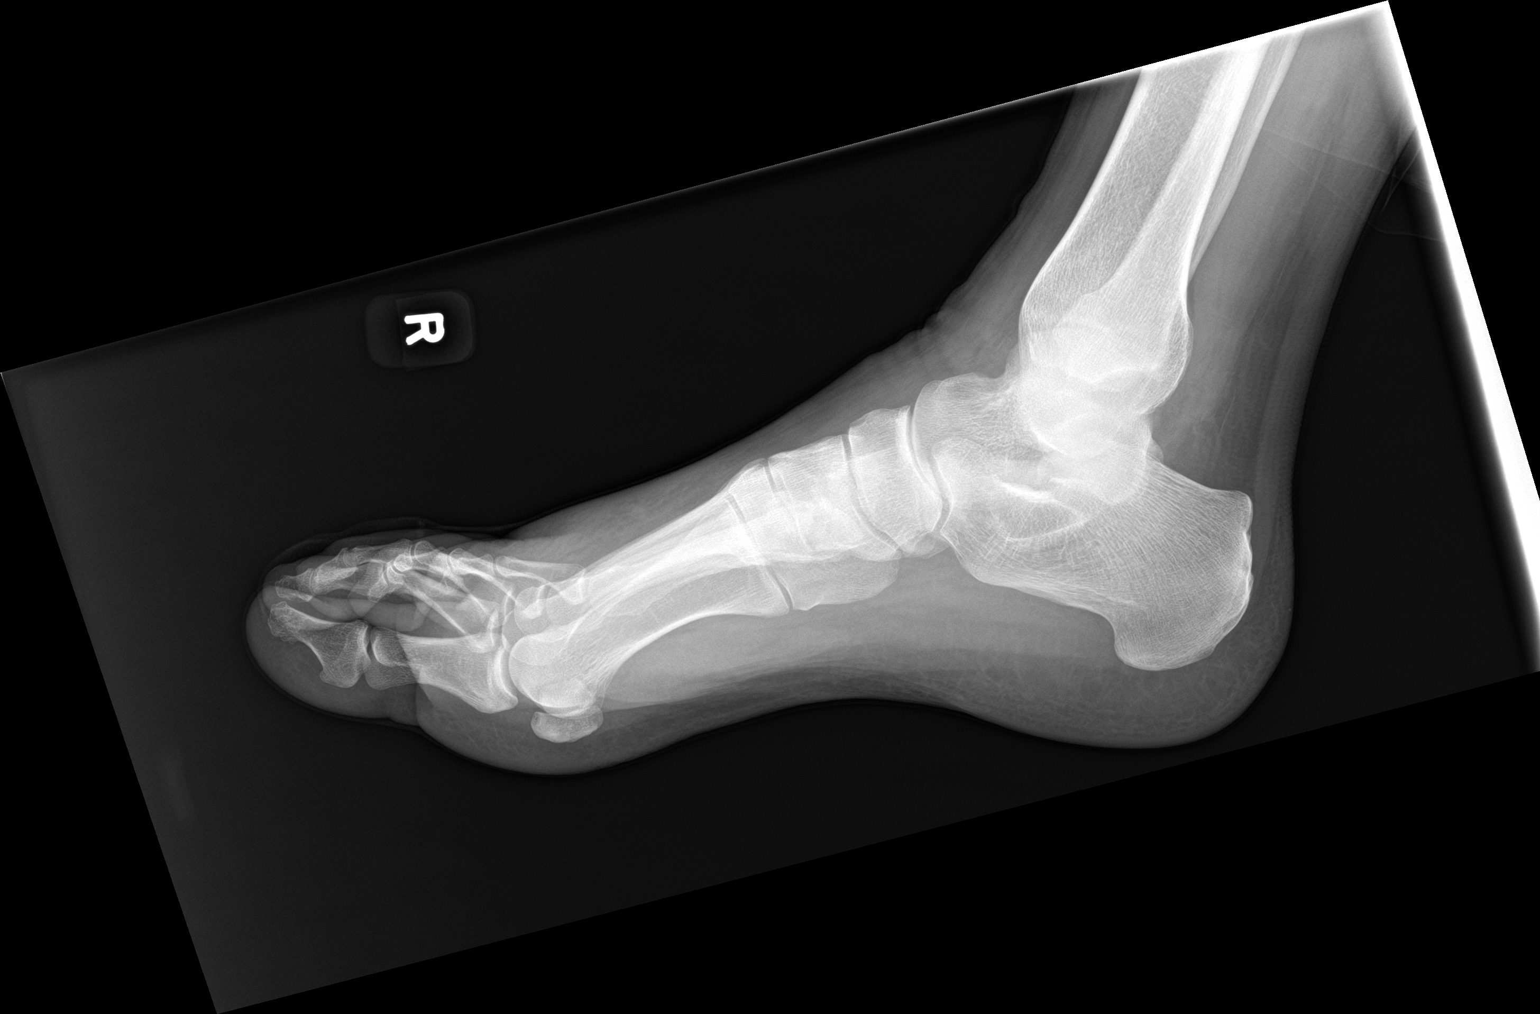

[3 of 3 positions shown; findings below may reference images not displayed]

FINDINGS: Acute avulsion fracture of the base of the fifth metatarsal with 3
mm medial displacement. No additional fracture. No dislocation.
Joint spaces are preserved. Bone mineralization is normal. Soft
tissues are unremarkable.
IMPRESSION: 1. Acute mildly displaced avulsion fracture at the base of the fifth
metatarsal.

## 2020-08-24 DIAGNOSIS — H05213 Displacement (lateral) of globe, bilateral: Secondary | ICD-10-CM | POA: Diagnosis not present

## 2020-08-24 DIAGNOSIS — H0520 Unspecified exophthalmos: Secondary | ICD-10-CM | POA: Diagnosis not present

## 2020-08-24 DIAGNOSIS — Z01812 Encounter for preprocedural laboratory examination: Secondary | ICD-10-CM | POA: Diagnosis not present

## 2020-08-24 DIAGNOSIS — E063 Autoimmune thyroiditis: Secondary | ICD-10-CM | POA: Diagnosis not present

## 2020-09-07 DIAGNOSIS — H05213 Displacement (lateral) of globe, bilateral: Secondary | ICD-10-CM | POA: Diagnosis not present

## 2020-09-07 DIAGNOSIS — H0520 Unspecified exophthalmos: Secondary | ICD-10-CM | POA: Diagnosis not present

## 2020-11-07 DIAGNOSIS — E0590 Thyrotoxicosis, unspecified without thyrotoxic crisis or storm: Secondary | ICD-10-CM | POA: Diagnosis not present

## 2020-11-07 DIAGNOSIS — H52223 Regular astigmatism, bilateral: Secondary | ICD-10-CM | POA: Diagnosis not present

## 2020-11-07 DIAGNOSIS — H5203 Hypermetropia, bilateral: Secondary | ICD-10-CM | POA: Diagnosis not present

## 2020-11-07 DIAGNOSIS — H5022 Vertical strabismus, left eye: Secondary | ICD-10-CM | POA: Diagnosis not present

## 2020-11-07 DIAGNOSIS — H05241 Constant exophthalmos, right eye: Secondary | ICD-10-CM | POA: Diagnosis not present

## 2020-11-07 DIAGNOSIS — H50012 Monocular esotropia, left eye: Secondary | ICD-10-CM | POA: Diagnosis not present

## 2021-02-07 DIAGNOSIS — Z125 Encounter for screening for malignant neoplasm of prostate: Secondary | ICD-10-CM | POA: Diagnosis not present

## 2021-02-07 DIAGNOSIS — Z0000 Encounter for general adult medical examination without abnormal findings: Secondary | ICD-10-CM | POA: Diagnosis not present

## 2021-02-07 DIAGNOSIS — E890 Postprocedural hypothyroidism: Secondary | ICD-10-CM | POA: Diagnosis not present

## 2021-02-12 DIAGNOSIS — H50012 Monocular esotropia, left eye: Secondary | ICD-10-CM | POA: Diagnosis not present

## 2021-02-12 DIAGNOSIS — H5022 Vertical strabismus, left eye: Secondary | ICD-10-CM | POA: Diagnosis not present

## 2021-02-12 DIAGNOSIS — E063 Autoimmune thyroiditis: Secondary | ICD-10-CM | POA: Diagnosis not present

## 2021-02-12 DIAGNOSIS — H05213 Displacement (lateral) of globe, bilateral: Secondary | ICD-10-CM | POA: Diagnosis not present

## 2021-02-12 DIAGNOSIS — H02534 Eyelid retraction left upper eyelid: Secondary | ICD-10-CM | POA: Diagnosis not present

## 2021-02-14 DIAGNOSIS — E0500 Thyrotoxicosis with diffuse goiter without thyrotoxic crisis or storm: Secondary | ICD-10-CM | POA: Diagnosis not present

## 2021-02-14 DIAGNOSIS — Z0000 Encounter for general adult medical examination without abnormal findings: Secondary | ICD-10-CM | POA: Diagnosis not present

## 2021-02-14 DIAGNOSIS — E890 Postprocedural hypothyroidism: Secondary | ICD-10-CM | POA: Diagnosis not present

## 2021-02-14 DIAGNOSIS — Z23 Encounter for immunization: Secondary | ICD-10-CM | POA: Diagnosis not present

## 2021-02-14 DIAGNOSIS — E782 Mixed hyperlipidemia: Secondary | ICD-10-CM | POA: Diagnosis not present

## 2021-04-11 DIAGNOSIS — E0500 Thyrotoxicosis with diffuse goiter without thyrotoxic crisis or storm: Secondary | ICD-10-CM | POA: Diagnosis not present

## 2021-04-11 DIAGNOSIS — E890 Postprocedural hypothyroidism: Secondary | ICD-10-CM | POA: Diagnosis not present

## 2021-04-11 DIAGNOSIS — E782 Mixed hyperlipidemia: Secondary | ICD-10-CM | POA: Diagnosis not present

## 2021-04-18 DIAGNOSIS — E890 Postprocedural hypothyroidism: Secondary | ICD-10-CM | POA: Diagnosis not present

## 2021-04-18 DIAGNOSIS — E782 Mixed hyperlipidemia: Secondary | ICD-10-CM | POA: Diagnosis not present

## 2021-04-18 DIAGNOSIS — E0500 Thyrotoxicosis with diffuse goiter without thyrotoxic crisis or storm: Secondary | ICD-10-CM | POA: Diagnosis not present

## 2021-08-22 DIAGNOSIS — E0500 Thyrotoxicosis with diffuse goiter without thyrotoxic crisis or storm: Secondary | ICD-10-CM | POA: Diagnosis not present

## 2021-08-22 DIAGNOSIS — E890 Postprocedural hypothyroidism: Secondary | ICD-10-CM | POA: Diagnosis not present

## 2021-08-22 DIAGNOSIS — E782 Mixed hyperlipidemia: Secondary | ICD-10-CM | POA: Diagnosis not present

## 2021-08-29 DIAGNOSIS — E890 Postprocedural hypothyroidism: Secondary | ICD-10-CM | POA: Diagnosis not present

## 2021-08-29 DIAGNOSIS — E0500 Thyrotoxicosis with diffuse goiter without thyrotoxic crisis or storm: Secondary | ICD-10-CM | POA: Diagnosis not present

## 2021-08-29 DIAGNOSIS — E782 Mixed hyperlipidemia: Secondary | ICD-10-CM | POA: Diagnosis not present

## 2021-09-19 DIAGNOSIS — Z1211 Encounter for screening for malignant neoplasm of colon: Secondary | ICD-10-CM | POA: Diagnosis not present

## 2022-01-15 DIAGNOSIS — Z1211 Encounter for screening for malignant neoplasm of colon: Secondary | ICD-10-CM | POA: Diagnosis not present

## 2022-02-27 DIAGNOSIS — Z0000 Encounter for general adult medical examination without abnormal findings: Secondary | ICD-10-CM | POA: Diagnosis not present

## 2022-02-27 DIAGNOSIS — E782 Mixed hyperlipidemia: Secondary | ICD-10-CM | POA: Diagnosis not present

## 2022-02-27 DIAGNOSIS — E890 Postprocedural hypothyroidism: Secondary | ICD-10-CM | POA: Diagnosis not present

## 2022-02-27 DIAGNOSIS — E0500 Thyrotoxicosis with diffuse goiter without thyrotoxic crisis or storm: Secondary | ICD-10-CM | POA: Diagnosis not present

## 2022-02-27 DIAGNOSIS — Z125 Encounter for screening for malignant neoplasm of prostate: Secondary | ICD-10-CM | POA: Diagnosis not present

## 2022-03-07 DIAGNOSIS — E890 Postprocedural hypothyroidism: Secondary | ICD-10-CM | POA: Diagnosis not present

## 2022-03-07 DIAGNOSIS — Z23 Encounter for immunization: Secondary | ICD-10-CM | POA: Diagnosis not present

## 2022-03-07 DIAGNOSIS — E0500 Thyrotoxicosis with diffuse goiter without thyrotoxic crisis or storm: Secondary | ICD-10-CM | POA: Diagnosis not present

## 2022-03-07 DIAGNOSIS — E782 Mixed hyperlipidemia: Secondary | ICD-10-CM | POA: Diagnosis not present

## 2022-03-07 DIAGNOSIS — Z0000 Encounter for general adult medical examination without abnormal findings: Secondary | ICD-10-CM | POA: Diagnosis not present

## 2022-07-26 DIAGNOSIS — H53143 Visual discomfort, bilateral: Secondary | ICD-10-CM | POA: Diagnosis not present

## 2022-07-26 DIAGNOSIS — H5203 Hypermetropia, bilateral: Secondary | ICD-10-CM | POA: Diagnosis not present

## 2023-03-05 DIAGNOSIS — Z0000 Encounter for general adult medical examination without abnormal findings: Secondary | ICD-10-CM | POA: Diagnosis not present

## 2023-03-05 DIAGNOSIS — E782 Mixed hyperlipidemia: Secondary | ICD-10-CM | POA: Diagnosis not present

## 2023-03-05 DIAGNOSIS — Z125 Encounter for screening for malignant neoplasm of prostate: Secondary | ICD-10-CM | POA: Diagnosis not present

## 2023-03-12 DIAGNOSIS — Z0000 Encounter for general adult medical examination without abnormal findings: Secondary | ICD-10-CM | POA: Diagnosis not present

## 2023-03-12 DIAGNOSIS — H8110 Benign paroxysmal vertigo, unspecified ear: Secondary | ICD-10-CM | POA: Diagnosis not present

## 2023-03-12 DIAGNOSIS — E782 Mixed hyperlipidemia: Secondary | ICD-10-CM | POA: Diagnosis not present

## 2023-03-12 DIAGNOSIS — Z23 Encounter for immunization: Secondary | ICD-10-CM | POA: Diagnosis not present

## 2023-03-12 DIAGNOSIS — E0500 Thyrotoxicosis with diffuse goiter without thyrotoxic crisis or storm: Secondary | ICD-10-CM | POA: Diagnosis not present

## 2023-03-12 DIAGNOSIS — E890 Postprocedural hypothyroidism: Secondary | ICD-10-CM | POA: Diagnosis not present

## 2023-04-30 DIAGNOSIS — E890 Postprocedural hypothyroidism: Secondary | ICD-10-CM | POA: Diagnosis not present

## 2023-09-24 DIAGNOSIS — E0500 Thyrotoxicosis with diffuse goiter without thyrotoxic crisis or storm: Secondary | ICD-10-CM | POA: Diagnosis not present

## 2023-09-24 DIAGNOSIS — E890 Postprocedural hypothyroidism: Secondary | ICD-10-CM | POA: Diagnosis not present

## 2023-09-24 DIAGNOSIS — E782 Mixed hyperlipidemia: Secondary | ICD-10-CM | POA: Diagnosis not present

## 2023-10-02 DIAGNOSIS — E782 Mixed hyperlipidemia: Secondary | ICD-10-CM | POA: Diagnosis not present

## 2023-10-02 DIAGNOSIS — E890 Postprocedural hypothyroidism: Secondary | ICD-10-CM | POA: Diagnosis not present

## 2023-10-02 DIAGNOSIS — E0500 Thyrotoxicosis with diffuse goiter without thyrotoxic crisis or storm: Secondary | ICD-10-CM | POA: Diagnosis not present

## 2024-02-06 ENCOUNTER — Ambulatory Visit: Payer: Self-pay | Admitting: Podiatry

## 2024-02-06 ENCOUNTER — Encounter: Payer: Self-pay | Admitting: Podiatry

## 2024-02-06 ENCOUNTER — Ambulatory Visit (INDEPENDENT_AMBULATORY_CARE_PROVIDER_SITE_OTHER)

## 2024-02-06 DIAGNOSIS — M7661 Achilles tendinitis, right leg: Secondary | ICD-10-CM

## 2024-02-06 DIAGNOSIS — M7662 Achilles tendinitis, left leg: Secondary | ICD-10-CM

## 2024-02-06 MED ORDER — MELOXICAM 15 MG PO TABS: 15.0000 mg | ORAL_TABLET | Freq: Every day | ORAL | 1 refills | Status: DC | PRN

## 2024-02-06 NOTE — Progress Notes (Signed)
  Subjective:  Patient ID: George Long, male    DOB: 12-28-1970,  MRN: [id number]  Chief Complaint  Patient presents with   Foot Pain    Achille's tendonitis bilateral x 3 years. 3 pain that is constant. Doing exercises. Non diabetic.    Discussed the use of AI scribe software for clinical note transcription with the patient, who gave verbal consent to proceed.  History of Present Illness George Long is a 53 year old male who presents with bilateral Achilles tendon pain.  He has experienced bilateral Achilles tendon pain for three years, with more intensity on the right side. The pain is localized to the Achilles area and is tender to touch. Walking worsens the pain, which has increased with more frequent exercise this year.  He increased his daily steps from 2,000 to 7,000 to boost physical activity. He has not received treatment for the tendon pain. No recent foot injuries, but he had a left foot fracture three to four years ago.  He initially wore loose Skechers shoes with inadequate support and has switched to running and hiking shoes for better support. He massages the area to enhance blood flow.  No pain in the heel or other foot areas aside from the Achilles region.      Objective:  There were no vitals filed for this visit.  Physical Exam General: AAO x3, NAD  Dermatological: Skin is warm, dry and supple bilateral. There are no open sores, no preulcerative lesions, no rash or signs of infection present.  Vascular: Dorsalis Pedis artery and Posterior Tibial artery pedal pulses are 2/4 bilateral with immedate capillary fill time. There is no pain with calf compression, swelling, warmth, erythema.   Neruologic: Grossly intact via light touch bilateral.   Musculoskeletal: There is tenderness ovation of the distal portion of the Achilles tendon bilaterally right side worse than left.  There is no deficit noted on the Achilles tendon the tendon appears to be intact  bilaterally.  Equinus is present.  There is no area of pinpoint tenderness.  Gait: Unassisted, Nonantalgic.     No images are attached to the encounter.    Results X-rays were obtained reviewed bilaterally.  No evidence of acute fracture noted bilaterally.  Joint spaces maintained.    Assessment:   1. Achilles tendinitis of both lower extremities      Plan:  Patient was evaluated and treated and all questions answered.  Assessment and Plan Assessment & Plan Bilateral Achilles tendinitis Chronic bilateral Achilles tendinitis for three years, worsened by activity, more severe on the right with tendon thickening. No acute injury. Skechers shoes may lack support. - Prescribed meloxicam  once daily for inflammation, then as needed. - Advised daily icing of Achilles area. - Provided rehabilitation exercises for stretching before and after walks. - Recommended supportive lace-up shoes. - Suggested visiting a store like [employer] for proper shoe fitting. - Discussed potential physical therapy if no improvement with home exercises.   Return in about 2 months (around 04/07/2024), or if symptoms worsen or fail to improve.   Donnice JONELLE Fees DPM

## 2024-02-06 NOTE — Patient Instructions (Signed)

## 2024-03-14 ENCOUNTER — Other Ambulatory Visit: Payer: Self-pay | Admitting: Podiatry

## 2024-03-17 DIAGNOSIS — E0500 Thyrotoxicosis with diffuse goiter without thyrotoxic crisis or storm: Secondary | ICD-10-CM | POA: Diagnosis not present

## 2024-03-17 DIAGNOSIS — E890 Postprocedural hypothyroidism: Secondary | ICD-10-CM | POA: Diagnosis not present

## 2024-03-17 DIAGNOSIS — Z125 Encounter for screening for malignant neoplasm of prostate: Secondary | ICD-10-CM | POA: Diagnosis not present

## 2024-03-17 DIAGNOSIS — E782 Mixed hyperlipidemia: Secondary | ICD-10-CM | POA: Diagnosis not present

## 2024-03-24 DIAGNOSIS — Z0000 Encounter for general adult medical examination without abnormal findings: Secondary | ICD-10-CM | POA: Diagnosis not present

## 2024-03-24 DIAGNOSIS — E782 Mixed hyperlipidemia: Secondary | ICD-10-CM | POA: Diagnosis not present

## 2024-03-24 DIAGNOSIS — E890 Postprocedural hypothyroidism: Secondary | ICD-10-CM | POA: Diagnosis not present

## 2024-03-24 DIAGNOSIS — E0500 Thyrotoxicosis with diffuse goiter without thyrotoxic crisis or storm: Secondary | ICD-10-CM | POA: Diagnosis not present

## 2024-03-24 DIAGNOSIS — Z23 Encounter for immunization: Secondary | ICD-10-CM | POA: Diagnosis not present
